# Patient Record
Sex: Female | Born: 1970 | Race: Black or African American | Hispanic: No | Marital: Married | Smoking: Never smoker
Health system: Southern US, Community
[De-identification: ages and names within clinical notes are randomized; demographics above are authoritative.]

## PROBLEM LIST (undated history)

## (undated) DIAGNOSIS — G35 Multiple sclerosis: Secondary | ICD-10-CM

## (undated) HISTORY — PX: OOPHORECTOMY: SHX86

## (undated) HISTORY — PX: TUBAL LIGATION: SHX77

---

## 1998-06-01 ENCOUNTER — Inpatient Hospital Stay (HOSPITAL_COMMUNITY): Admission: AD | Admit: 1998-06-01 | Discharge: 1998-06-01 | Payer: Self-pay | Admitting: *Deleted

## 1998-06-04 ENCOUNTER — Ambulatory Visit (HOSPITAL_COMMUNITY): Admission: RE | Admit: 1998-06-04 | Discharge: 1998-06-04 | Payer: Self-pay | Admitting: *Deleted

## 1998-10-29 ENCOUNTER — Emergency Department (HOSPITAL_COMMUNITY): Admission: EM | Admit: 1998-10-29 | Discharge: 1998-10-29 | Payer: Self-pay | Admitting: Emergency Medicine

## 1999-03-03 ENCOUNTER — Inpatient Hospital Stay (HOSPITAL_COMMUNITY): Admission: EM | Admit: 1999-03-03 | Discharge: 1999-03-03 | Payer: Self-pay | Admitting: *Deleted

## 2000-02-02 ENCOUNTER — Emergency Department (HOSPITAL_COMMUNITY): Admission: EM | Admit: 2000-02-02 | Discharge: 2000-02-03 | Payer: Self-pay | Admitting: Emergency Medicine

## 2000-04-20 ENCOUNTER — Emergency Department (HOSPITAL_COMMUNITY): Admission: EM | Admit: 2000-04-20 | Discharge: 2000-04-20 | Payer: Self-pay | Admitting: Emergency Medicine

## 2000-04-23 ENCOUNTER — Emergency Department (HOSPITAL_COMMUNITY): Admission: EM | Admit: 2000-04-23 | Discharge: 2000-04-23 | Payer: Self-pay | Admitting: Emergency Medicine

## 2000-04-23 ENCOUNTER — Encounter: Payer: Self-pay | Admitting: Emergency Medicine

## 2000-04-25 ENCOUNTER — Emergency Department (HOSPITAL_COMMUNITY): Admission: EM | Admit: 2000-04-25 | Discharge: 2000-04-25 | Payer: Self-pay | Admitting: Emergency Medicine

## 2002-01-24 ENCOUNTER — Encounter: Admission: RE | Admit: 2002-01-24 | Discharge: 2002-01-24 | Payer: Self-pay | Admitting: Family Medicine

## 2002-01-24 ENCOUNTER — Encounter: Payer: Self-pay | Admitting: Family Medicine

## 2006-04-11 ENCOUNTER — Emergency Department (HOSPITAL_COMMUNITY): Admission: EM | Admit: 2006-04-11 | Discharge: 2006-04-11 | Payer: Self-pay | Admitting: Emergency Medicine

## 2015-02-08 ENCOUNTER — Emergency Department (HOSPITAL_COMMUNITY): Payer: Medicare Other

## 2015-02-08 ENCOUNTER — Encounter (HOSPITAL_COMMUNITY): Payer: Self-pay | Admitting: Emergency Medicine

## 2015-02-08 ENCOUNTER — Emergency Department (HOSPITAL_COMMUNITY)
Admission: EM | Admit: 2015-02-08 | Discharge: 2015-02-08 | Disposition: A | Discharge status: Home / Self Care | Payer: Medicare Other | Attending: Emergency Medicine | Admitting: Emergency Medicine

## 2015-02-08 DIAGNOSIS — S0990XA Unspecified injury of head, initial encounter: Secondary | ICD-10-CM | POA: Insufficient documentation

## 2015-02-08 DIAGNOSIS — Y92410 Unspecified street and highway as the place of occurrence of the external cause: Secondary | ICD-10-CM | POA: Insufficient documentation

## 2015-02-08 DIAGNOSIS — Y999 Unspecified external cause status: Secondary | ICD-10-CM | POA: Insufficient documentation

## 2015-02-08 DIAGNOSIS — S3992XA Unspecified injury of lower back, initial encounter: Secondary | ICD-10-CM | POA: Diagnosis not present

## 2015-02-08 DIAGNOSIS — M549 Dorsalgia, unspecified: Secondary | ICD-10-CM

## 2015-02-08 DIAGNOSIS — S24109A Unspecified injury at unspecified level of thoracic spinal cord, initial encounter: Secondary | ICD-10-CM | POA: Insufficient documentation

## 2015-02-08 DIAGNOSIS — S199XXA Unspecified injury of neck, initial encounter: Secondary | ICD-10-CM | POA: Insufficient documentation

## 2015-02-08 DIAGNOSIS — M542 Cervicalgia: Secondary | ICD-10-CM

## 2015-02-08 DIAGNOSIS — R51 Headache: Secondary | ICD-10-CM

## 2015-02-08 DIAGNOSIS — R519 Headache, unspecified: Secondary | ICD-10-CM

## 2015-02-08 DIAGNOSIS — Z8669 Personal history of other diseases of the nervous system and sense organs: Secondary | ICD-10-CM | POA: Insufficient documentation

## 2015-02-08 DIAGNOSIS — Y939 Activity, unspecified: Secondary | ICD-10-CM | POA: Insufficient documentation

## 2015-02-08 HISTORY — DX: Multiple sclerosis: G35

## 2015-02-08 MED ORDER — HYDROMORPHONE HCL 1 MG/ML IJ SOLN
1.0000 mg | Freq: Once | INTRAMUSCULAR | Status: AC
  Administered 2015-02-08: 1 mg via INTRAVENOUS
  Filled 2015-02-08: qty 1

## 2015-02-08 MED ORDER — IOHEXOL 350 MG/ML SOLN
50.0000 mL | Freq: Once | INTRAVENOUS | Status: AC | PRN
  Administered 2015-02-08: 50 mL via INTRAVENOUS

## 2015-02-08 MED ORDER — SODIUM CHLORIDE 0.9 % IV BOLUS (SEPSIS)
1000.0000 mL | Freq: Once | INTRAVENOUS | Status: AC
  Administered 2015-02-08: 1000 mL via INTRAVENOUS

## 2015-02-08 MED ORDER — CYCLOBENZAPRINE HCL 10 MG PO TABS: 10.0000 mg | ORAL_TABLET | Freq: Two times a day (BID) | ORAL | Status: AC | PRN

## 2015-02-08 MED ORDER — IOHEXOL 300 MG/ML  SOLN
80.0000 mL | Freq: Once | INTRAMUSCULAR | Status: AC | PRN
  Administered 2015-02-08: 80 mL via INTRAVENOUS

## 2015-02-08 MED ORDER — OXYCODONE-ACETAMINOPHEN 5-325 MG PO TABS: 1.0000 | ORAL_TABLET | Freq: Four times a day (QID) | ORAL | Status: AC | PRN

## 2015-02-08 NOTE — Discharge Instructions (Signed)
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
